# Patient Record
Sex: Male | Born: 2000 | Race: Black or African American | Hispanic: No | Marital: Single | Smoking: Never smoker
Health system: Southern US, Community
[De-identification: ages and names within clinical notes are randomized; demographics above are authoritative.]

## PROBLEM LIST (undated history)

## (undated) DIAGNOSIS — H539 Unspecified visual disturbance: Secondary | ICD-10-CM

## (undated) HISTORY — DX: Unspecified visual disturbance: H53.9

## (undated) HISTORY — PX: APPENDECTOMY: SHX54

---
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
## 2022-01-31 ENCOUNTER — Other Ambulatory Visit: Payer: Self-pay

## 2022-01-31 ENCOUNTER — Emergency Department (HOSPITAL_COMMUNITY)
Admission: EM | Admit: 2022-01-31 | Discharge: 2022-01-31 | Disposition: A | Discharge status: Home / Self Care | Payer: Medicaid Other | Attending: Emergency Medicine | Admitting: Emergency Medicine

## 2022-01-31 ENCOUNTER — Encounter (HOSPITAL_COMMUNITY): Payer: Self-pay

## 2022-01-31 DIAGNOSIS — W01198A Fall on same level from slipping, tripping and stumbling with subsequent striking against other object, initial encounter: Secondary | ICD-10-CM | POA: Insufficient documentation

## 2022-01-31 DIAGNOSIS — S0990XA Unspecified injury of head, initial encounter: Secondary | ICD-10-CM | POA: Diagnosis present

## 2022-01-31 DIAGNOSIS — S0101XA Laceration without foreign body of scalp, initial encounter: Secondary | ICD-10-CM | POA: Insufficient documentation

## 2022-01-31 NOTE — ED Provider Triage Note (Signed)
Emergency Medicine Provider Triage Evaluation Note  Cormick Moss , a 21 y.o. male  was evaluated in triage.  Pt complains of fall.  Patient states he slipped on some water in the bathroom and struck his head against the floor.  Denies LOC.  States headache at first but seems better now.  No nausea/vomiting.  Tetanus UTD.  Review of Systems  Positive: laceration Negative: vomiting  Physical Exam  BP (!) 129/51   Pulse 98   Temp 98.7 F (37.1 C)   Resp 16   Ht 6\' 1"  (1.854 m)   Wt 97.5 kg   SpO2 100%   BMI 28.37 kg/m  Gen:   Awake, no distress   Resp:  Normal effort  MSK:   Moves extremities without difficulty  Other:  2cm laceration to occipital scalp  Medical Decision Making  Medically screening exam initiated at 10:14 PM.  Appropriate orders placed.  Espiridion Supinski was informed that the remainder of the evaluation will be completed by another provider, this initial triage assessment does not replace that evaluation, and the importance of remaining in the ED until their evaluation is complete.  Fall, laceration occipital scalp.  Tetanus UTD.  AAOx3, no focal deficits.  Will need lac repair.   Norval Morton, PA-C 01/31/22 2216

## 2022-01-31 NOTE — Discharge Instructions (Signed)
If you develop severe headache, nausea or vomiting please return for evaluation as discussed.  Overall you have had 2 staples placed in laceration to the back of your head.  These will need to be removed in about 7 to 10 days.  You can wash gently with soap and water but do not do any aggressive scrubbing in this area.

## 2022-01-31 NOTE — ED Triage Notes (Signed)
Pt reports he tripped and fell tonight at a restaurant onto a tile floor and hit the back of his head. No LOC. He reports mild headache right after but has resolved. He does have laceration to the back of his head, bleeding controlled. Does not take blood thinners. He reports neck pain as well.

## 2022-01-31 NOTE — ED Provider Notes (Signed)
[facility] EMERGENCY DEPARTMENT Provider Note   CSN: [id number] Arrival date & time: 01/31/22  2136     History  Chief Complaint  Patient presents with   Marletta Lor    Bill Jimenez is a 21 y.o. male.  Patient here with laceration to the back of his head after he slipped and fell.  No medical problems.  Does not take any blood thinners.  Patient states that he was at a restaurant when he walked into the bathroom where the floor was wet and slippery.  He fell back and hit the back of his head.  Did not lose consciousness.  Had a mild headache at first but no headache now.  No nausea or vomiting.  This occurred about 2 hours ago.  He denies any weakness or numbness or tingling.  No vision changes.  Denies any alcohol or drug use.  Denies any amnesia, disorientation, seizure.  Tetanus shot is up-to-date.  Denies any extremity pain or neck pain.  No chest pain or shortness of breath.  The history is provided by the patient.       Home Medications Prior to Admission medications   Medication Sig Start Date End Date Taking? Authorizing Provider  albuterol (PROVENTIL HFA;VENTOLIN HFA) 108 (90 Base) MCG/ACT inhaler Inhale 1-2 puffs into the lungs every 6 (six) hours as needed for wheezing or shortness of breath. Use 30 minutes before practice. Patient not taking: Reported on 09/26/2019 08/11/15 09/25/28  Estelle June, NP  ibuprofen (ADVIL,MOTRIN) 600 MG tablet Take 1 tablet (600 mg total) by mouth every 6 (six) hours as needed. Patient not taking: Reported on 07/27/2019 03/25/16   Georgiann Hahn, MD  triamcinolone (KENALOG) 0.025 % cream Apply 1 application topically 2 (two) times daily as needed. 04/05/20   Myles Gip, DO      Allergies    Patient has no known allergies.    Review of Systems   Review of Systems  Physical Exam Updated Vital Signs BP (!) 129/51   Pulse 98   Temp 98.7 F (37.1 C)   Resp 16   Ht 6\' 1"  (1.854 m)   Wt 97.5 kg   SpO2 100%    BMI 28.37 kg/m  Physical Exam Vitals and nursing note reviewed.  Constitutional:      General: He is not in acute distress.    Appearance: He is well-developed. He is not ill-appearing.  HENT:     Head:     Comments: No hemotympanum, 2 cm laceration to the occiput that is hemostatic    Right Ear: Tympanic membrane normal.     Left Ear: Tympanic membrane normal.     Nose: Nose normal.     Mouth/Throat:     Mouth: Mucous membranes are moist.  Eyes:     Extraocular Movements: Extraocular movements intact.     Conjunctiva/sclera: Conjunctivae normal.     Pupils: Pupils are equal, round, and reactive to light.  Cardiovascular:     Rate and Rhythm: Normal rate and regular rhythm.     Pulses: Normal pulses.     Heart sounds: Normal heart sounds. No murmur heard. Pulmonary:     Effort: Pulmonary effort is normal. No respiratory distress.     Breath sounds: Normal breath sounds.  Abdominal:     Palpations: Abdomen is soft.     Tenderness: There is no abdominal tenderness.  Musculoskeletal:        General: No swelling or tenderness.  Cervical back: Normal range of motion and neck supple.     Comments: No midline spinal tenderness  Skin:    General: Skin is warm and dry.     Capillary Refill: Capillary refill takes less than 2 seconds.  Neurological:     General: No focal deficit present.     Mental Status: He is alert and oriented to person, place, and time.     Cranial Nerves: No cranial nerve deficit.     Sensory: No sensory deficit.     Motor: No weakness.     Coordination: Coordination normal.     Gait: Gait normal.     Comments: 5+ out of 5 strength throughout, normal sensation, no drift, normal speech, normal finger-nose-finger, normal gait  Psychiatric:        Mood and Affect: Mood normal.     ED Results / Procedures / Treatments   Labs (all labs ordered are listed, but only abnormal results are displayed) Labs Reviewed - No data to  display  EKG None  Radiology No results found.  Procedures .Marland KitchenLaceration Repair  Date/Time: 01/31/2022 11:13 PM  Performed by: Virgina Norfolk, DO Authorized by: Virgina Norfolk, DO   Consent:    Consent obtained:  Verbal   Consent given by:  Patient   Risks, benefits, and alternatives were discussed: yes     Risks discussed:  Infection, need for additional repair, nerve damage, poor cosmetic result, poor wound healing, pain, tendon damage, vascular damage and retained foreign body   Alternatives discussed:  No treatment Universal protocol:    Procedure explained and questions answered to patient or proxy's satisfaction: yes     Relevant documents present and verified: yes     Patient identity confirmed:  Arm band and verbally with patient Anesthesia:    Anesthesia method:  None Laceration details:    Location:  Scalp   Scalp location:  Occipital   Length (cm):  2   Depth (mm):  1 Pre-procedure details:    Preparation:  Patient was prepped and draped in usual sterile fashion Exploration:    Limited defect created (wound extended): no     Imaging outcome: foreign body not noted     Wound exploration: wound explored through full range of motion and entire depth of wound visualized     Wound extent: areolar tissue not violated, fascia not violated, no foreign body, no signs of injury, no nerve damage, no tendon damage, no underlying fracture and no vascular damage     Contaminated: no   Treatment:    Area cleansed with:  Shur-Clens   Amount of cleaning:  Standard   Visualized foreign bodies/material removed: no     Debridement:  None   Undermining:  None Skin repair:    Repair method:  Staples   Number of staples:  2 Approximation:    Approximation:  Close Repair type:    Repair type:  Simple Post-procedure details:    Dressing:  Open (no dressing)   Procedure completion:  Tolerated     Medications Ordered in ED Medications - No data to display  ED Course/  Medical Decision Making/ A&P                           Medical Decision Making  Bill Jimenez is here with laceration.  Normal vitals.  No fever.  Patient tripped and fell in the bathroom with a wet floor.  Hit the back of his  head.  Did not lose consciousness.  Had a headache initially when it occurred but no longer has headache.  No neck pain.  No extremity pain.  Denies any nausea or vomiting.  Tetanus shot is up-to-date.  He has no signs of basilar skull fracture on exam.  He has small 2 cm hemostatic laceration to the back of his head.  He is not on blood thinners.  He is well-appearing.  Neurologically he is intact.  Ambulatory without any issues.  No midline spinal pain.  [place] head CT rule negative and no need for head CT.  Shared decision was made on this as well and patient is comfortable without a head CT.  He denies any alcohol or drug use.  He is overall very well-appearing.  Wound was washed out and stapled with 2 staples.  Wound care instructions given.  He understands to return for staple removal.  Overall given return precautions.  At this time he is over 2 hours post this injury and he is neurologically intact without any symptoms we will hold off on any imaging at this time.  Patient discharged in good condition.  This chart was dictated using voice recognition software.  Despite best efforts to proofread,  errors can occur which can change the documentation meaning.         Final Clinical Impression(s) / ED Diagnoses Final diagnoses:  Injury of head, initial encounter  Laceration of scalp without foreign body, initial encounter    Rx / DC Orders ED Discharge Orders     None         Virgina Norfolk, DO 01/31/22 2315

## 2022-02-03 ENCOUNTER — Telehealth: Payer: Self-pay | Admitting: Pediatrics

## 2022-02-03 NOTE — Telephone Encounter (Signed)
Transition Care Management Unsuccessful Follow-up Telephone Call  Date of discharge and from where:  01/31/2022 [facility]  Attempts:  1st Attempt  Reason for unsuccessful TCM follow-up call:  Left voice message

## 2022-02-11 ENCOUNTER — Ambulatory Visit (INDEPENDENT_AMBULATORY_CARE_PROVIDER_SITE_OTHER): Payer: Medicaid Other | Admitting: Pediatrics

## 2022-02-11 ENCOUNTER — Encounter: Payer: Self-pay | Admitting: Pediatrics

## 2022-02-11 VITALS — BP 124/68 | Ht 73.2 in | Wt 229.8 lb

## 2022-02-11 DIAGNOSIS — Z0000 Encounter for general adult medical examination without abnormal findings: Secondary | ICD-10-CM

## 2022-02-11 DIAGNOSIS — Z23 Encounter for immunization: Secondary | ICD-10-CM

## 2022-02-11 DIAGNOSIS — Z00129 Encounter for routine child health examination without abnormal findings: Secondary | ICD-10-CM

## 2022-02-11 DIAGNOSIS — Z68.41 Body mass index (BMI) pediatric, 85th percentile to less than 95th percentile for age: Secondary | ICD-10-CM

## 2022-02-11 NOTE — Progress Notes (Unsigned)
Adolescent Well Care Visit Bill Jimenez is a 21 y.o. male who is here for well care.    PCP:  Myles Gip, DO   History was provided by the patient.  Confidentiality was discussed with the patient and, if applicable, with caregiver as well.  Current Issues: Current concerns include:  Graduated and working at call center.  Slipped and fell about 1 week ago and staple need removed today.  Have started cardio and running bid weekly.   Nutrition: Nutrition/Eating Behaviors: good eater, 2 meals/day plus snacks, eats all food groups, mainly drinks water, protein shakes  Adequate calcium in diet?: adequate Supplements/ Vitamins: multivi  Exercise/ Media: Play any Sports?/ Exercise: cardio  Screen Time:  > 2 hours-counseling provided Media Rules or Monitoring?: no  Sleep:  Sleep: 8hrs  Social Screening: Lives with:  mom Parental relations:  good Activities, Work, and Regulatory affairs officer?: yes Concerns regarding behavior with peers?  no Stressors of note: no  Education: School Name: grauated, currently working at call center.  Plans to apply to college.    Menstruation:   No LMP for male patient. Menstrual History: male   Confidential Social History: Tobacco?  no Secondhand smoke exposure?  no Drugs/ETOH?  no  Sexually Active?  Yes, 1 partner, was tested as he wasn't wearing condoms.  Declines testing today. Pregnancy Prevention: discussed  Safe at home, in school & in relationships?  Yes Safe to self?  Yes   Screenings: Patient has a dental home: yes, has dentist, brush bid   In addition, the following topics were discussed as part of anticipatory guidance healthy eating, exercise, condom use, and screen time.  PHQ-9 completed and results indicated no concerns. Score: 0   Physical Exam:  Vitals:   02/11/22 1146  BP: 124/68  Weight: 229 lb 12.8 oz (104.2 kg)  Height: 6' 1.2" (1.859 m)   BP 124/68   Ht 6' 1.2" (1.859 m)   Wt 229 lb 12.8 oz (104.2 kg)   BMI  30.15 kg/m  Body mass index: body mass index is 30.15 kg/m. Blood pressure %iles are not available for patients who are 18 years or older.  Hearing Screening   500Hz  1000Hz  2000Hz  3000Hz  4000Hz   Right ear 25 25 25 25 25   Left ear 25 25 25 25 25    Vision Screening   Right eye Left eye Both eyes  Without correction 10/10 10/10   With correction       General Appearance:   alert, oriented, no acute distress and well nourished  HENT: Normocephalic, no obvious abnormality, conjunctiva clear  Mouth:   Normal appearing teeth, no obvious discoloration, dental caries, or dental caps  Neck:   Supple; thyroid: no enlargement, symmetric, no tenderness/mass/nodules     Lungs:   Clear to auscultation bilaterally, normal work of breathing  Heart:   Regular rate and rhythm, S1 and S2 normal, no murmurs;   Abdomen:   Soft, non-tender, no mass, or organomegaly  GU normal male genitals, no testicular masses or hernia, Tanner stage 5  Musculoskeletal:   Tone and strength strong and symmetrical, all extremities  no scoliosis             Lymphatic:   No cervical adenopathy  Skin/Hair/Nails:   Skin warm, dry and intact, no rashes, no bruises or petechiae  Neurologic:   Strength, gait, and coordination normal and age-appropriate     Assessment and Plan:   1. Encounter for well adolescent visit   2. BMI (body  mass index), pediatric, 85% to less than 95% for age     --discussed transitioning to adult care  BMI is not appropriate for age:  has made significant changes in in diet and exercising with significant decrease in weight and BMI.  Encouraged to continue good practices.    Hearing screening result:normal Vision screening result: normal  Counseling provided for all of the vaccine components  Orders Placed This Encounter  Procedures   Meningococcal B, OMV (Bexsero)   Flu Vaccine QUAD 6+ mos PF IM (Fluarix Quad PF)  --Indications, contraindications and side effects of vaccine/vaccines  discussed with parent and parent verbally expressed understanding and also agreed with the administration of vaccine/vaccines as ordered above  today.    Return in about 1 year (around 02/12/2023).Marland Kitchen  Myles Gip, DO

## 2022-02-11 NOTE — Patient Instructions (Signed)
Well Child Nutrition, Young Adult The following information provides general nutrition recommendations. Talk with a health care provider or a diet and nutrition specialist (dietitian) if you have any questions. Nutrition The amount of food you need to eat every day depends on your age, sex, size, and activity level. To figure out your daily calorie needs, look for a calorie calculator online or talk with your health care provider. Balanced diet Eat a balanced diet. Try to include: Fruits. Aim for 1-2 cups a day. Examples of 1 cup of fruit include 1 large banana, 1 small apple, 8 large strawberries, 1 large orange,  cup (80 g) dried fruit, or 1 cup (250 mL) of 100% fruit juice. Eat a variety of whole fruits and 100% fruit juice. Choose fresh, canned, frozen, or dried forms. Choose canned fruit that has the lowest added sugar or no added sugar. Vegetables. Aim for 2-4 cups a day. Examples of 1 cup of vegetables include 2 medium carrots, 1 large tomato, 2 stalks of celery, or 2 cups (62 g) of raw leafy greens. Choose fresh, frozen, canned, and dried options. Eat vegetables of a variety of colors. Low-fat or fat-free dairy. Aim for 3 cups a day. Examples of 1 cup of dairy include 8 oz (230 mL) of milk, 8 oz (230 g) of yogurt, or 1 oz (44 g) of natural cheese. If you are unable to tolerate dairy (lactose intolerant) or you choose not to consume dairy, you may include fortified soy beverages (soy milk). Grains. Aim for 6-10 "ounce-equivalents" of grain foods (such as pasta, rice, and tortillas) a day. Examples of 1 ounce-equivalent of grains include 1 cup (60 g) of ready-to-eat cereal,  cup (79 g) of cooked rice, or 1 slice of bread. Of the grain foods that you eat each day, aim to include 3-5 ounce-equivalents of whole-grain options. Examples of whole grains include whole wheat, brown rice, wild rice, quinoa, and oats. Lean proteins. Aim for 5-7 ounce-equivalents a day. Eat a variety of protein foods,  including lean meats, seafood, poultry, eggs, legumes (beans and peas), nuts, seeds, and soy products. A cut of meat or fish that is the size of a deck of cards is about 3-4 ounce-equivalents (85 g). Foods that provide 1 ounce-equivalent of protein include 1 egg,  ounce (28 g) of nuts or seeds, or 1 tablespoon (16 g) of peanut butter. For more information and options for foods in a balanced diet, visit www.choosemyplate.gov Tips for healthy snacking A snack should not be the size of a full meal. Eat snacks that have 200 calories or less. Examples include:  whole-wheat pita with  cup (40 g) hummus. 2 or 3 slices of deli turkey wrapped around a cheese stick.  apple with 1 tablespoon (16 g) of peanut butter. 10 baked chips with salsa. Keep cut-up fruits and vegetables available at home and at school so they are easy to eat. Pack healthy snacks the night before or when you pack your lunch. Avoid pre-packaged foods. These tend to be higher in fat, sugar, and salt (sodium). Get involved with shopping, or ask the primary food shopper in your household to get healthy snacks that you like. Avoid chips, candy, cake, and soft drinks. Foods to avoid Fried or heavily processed foods, such as toaster pastries and microwaveable dinners. Drinks that contain a lot of sugar, such as sports drinks, sodas, and juice. Foods that contain a lot of fat, sodium, or sugar. Food safety Prepare your food safely: Wash your hands   after handling raw meats. Keep food preparation surfaces clean by washing them regularly with hot, soapy water. Keep raw meats separate from foods that are ready-to-eat, such as fruits and vegetables. Cook seafood, meat, poultry, and eggs to the recommended minimum safe internal temperature. Store foods at safe temperatures. In general: Keep cold foods at 40F (4C) or colder. Keep your freezer at 0F (-18C or 18 degrees below 0C) or colder. Keep hot foods at 140F (60C) or  warmer. Foods are no longer safe to eat when they have been at a temperature of 40-140F (4-60C) for more than 2 hours. Physical activity Try to get 150 minutes of moderate-intensity physical activity each week. Examples include walking briskly or bicycling slower than 10 miles an hour (16 km an hour). Do muscle-strengthening exercises on 2 or more days a week. If you find it difficult to fit regular physical activity into your schedule, try: Taking the stairs instead of the elevator. Parking your car farther from the entrance or at the back of the parking lot. Biking or walking to work or school. If you need to lose weight, you may need to reduce your daily calorie intake and increase your daily amount of physical activity. Check with your health care provider before you start a new diet and exercise plan. General instructions Do not skip meals, especially breakfast. Water is the ideal beverage. Aim to drink six 8-oz (240 mL) glasses of water each day. Avoid fad diets. These may affect your mood and growth. If you choose to drink alcohol: Drink in moderation. This means two drinks a day for men and one drink a day for women who are not pregnant. One drink equals 12 oz (355 mL) of beer, 5 oz (148 mL) of wine, or 1 oz (44 mL) of hard liquor. You may drink coffee. It is recommended that you limit coffee intake to three to five 8-oz (240 mL) cups a day (up to 400 mg of caffeine). If you are worried about your body image, talk with your parents, your health care provider, or another trusted adult like a coach or counselor. You may be at risk for developing an eating disorder. Eating disorders can lead to serious medical problems. Food allergies may cause you to have a reaction (such as a rash, diarrhea, or vomiting) after eating or drinking. Talk with your health care provider if you have concerns about food allergies. Summary Eat a balanced diet. Include fruits, vegetables, low-fat dairy, whole  grains, and lean proteins. Try to get 150 minutes of moderate-intensity physical activity each week, and do muscle-strengthening exercises on 2 or more days a week. Choose healthy snacks that are 200 calories or less. Drink plenty of water. Try to drink six 8-oz (240 mL) glasses a day. This information is not intended to replace advice given to you by your health care provider. Make sure you discuss any questions you have with your health care provider. Document Revised: 02/19/2021 Document Reviewed: 02/19/2021 Elsevier Patient Education  2023 Elsevier Inc.  

## 2022-02-13 ENCOUNTER — Encounter: Payer: Self-pay | Admitting: Pediatrics

## 2022-02-20 IMAGING — CT CT HEAD W/O CM
4 series · 17 of 47 positions shown, 19 images · non-contrast
Comparison: None.

CLINICAL DATA: Generalized weakness and vision changes

EXAM:
CT HEAD WITHOUT CONTRAST
TECHNIQUE: Contiguous axial images were obtained from the base of the skull
through the vertex without intravenous contrast.

[Series 3: head wo · axial · 0.47mm/px · z∈[-204,-70]mm · 7 of 37 slices shown, 9 images]
[im 5/37  brain]
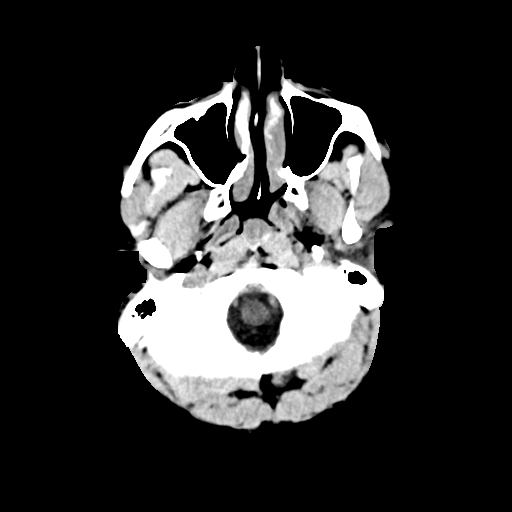
[im 5/37  bone]
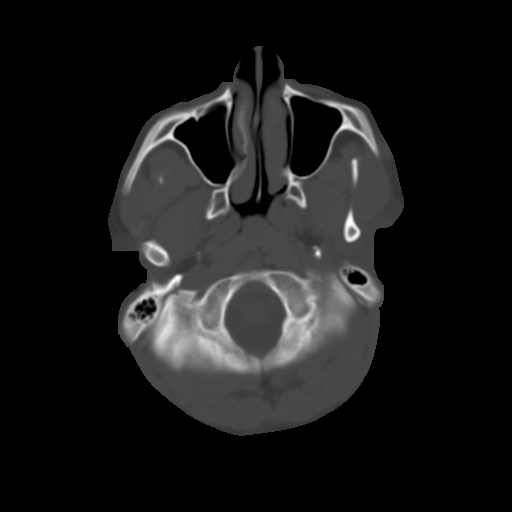
[im 10/37  brain]
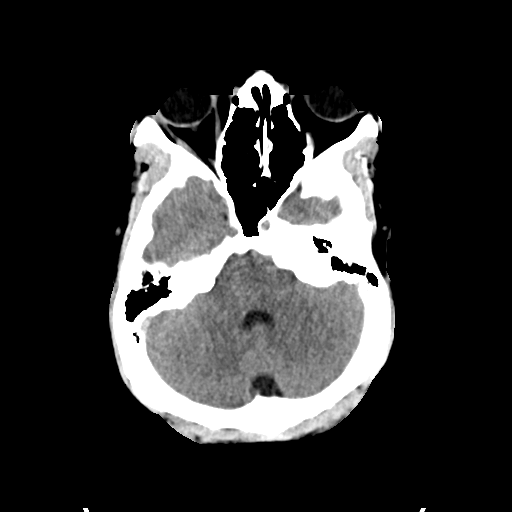
[im 14/37  brain]
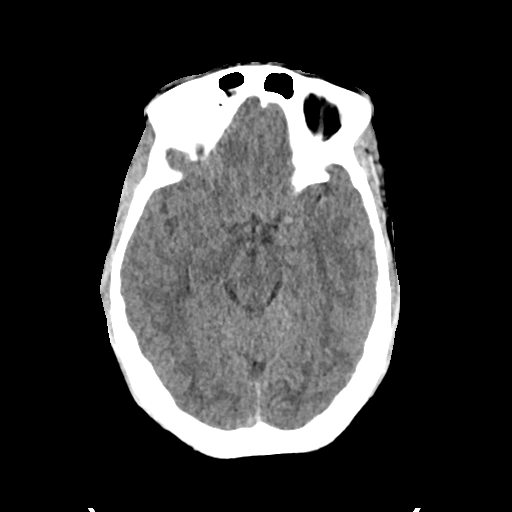
[im 19/37  brain]
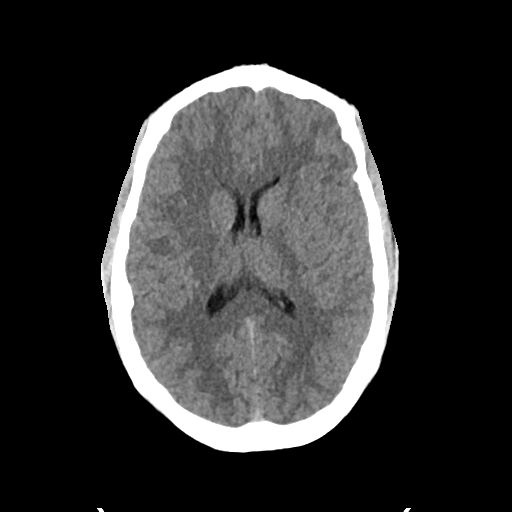
[im 23/37  brain]
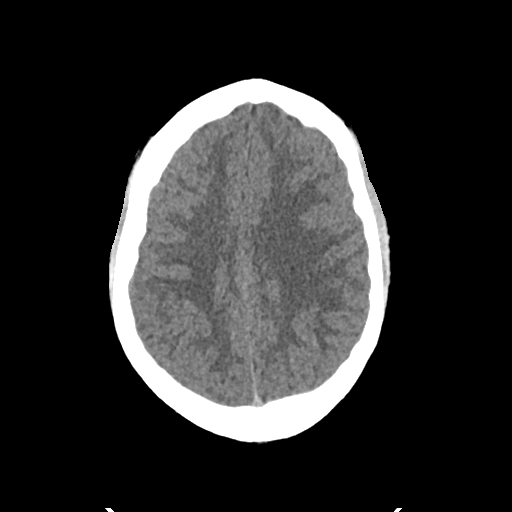
[im 23/37  bone]
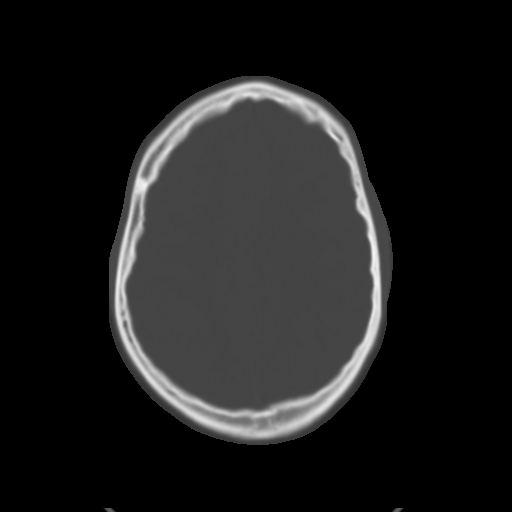
[im 28/37  brain]
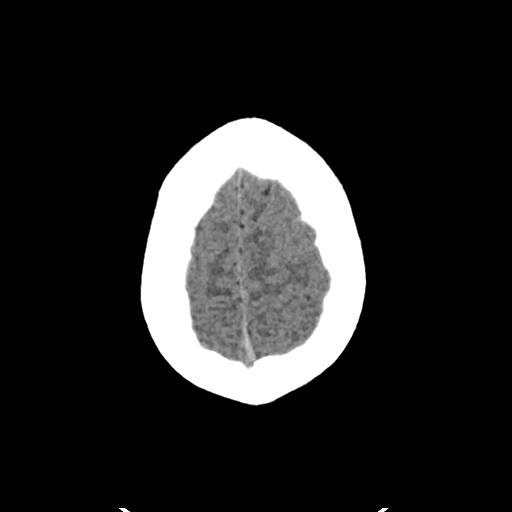
[im 32/37  brain]
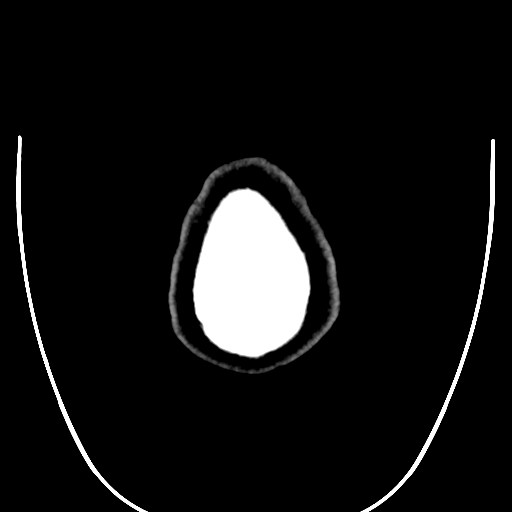

[Series 4: head bone · axial · 0.47mm/px · z∈[-206,-144]mm · 4 of 91 slices shown]
[im 10/91  bone]
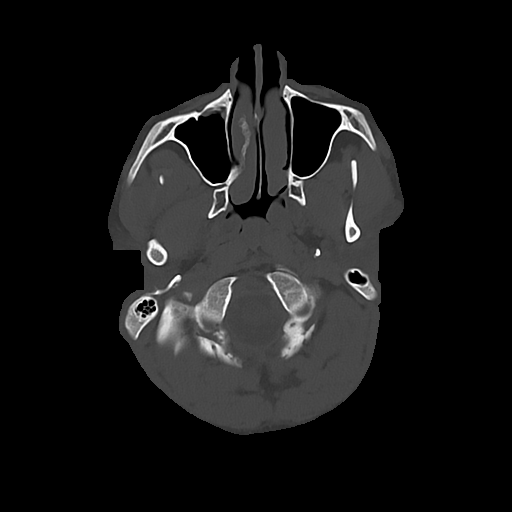
[im 19/91  bone]
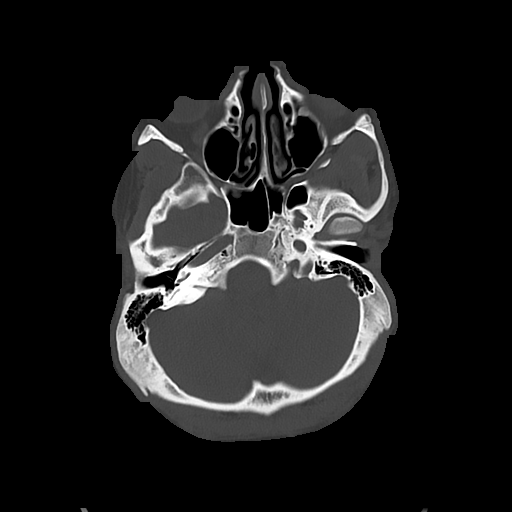
[im 28/91  bone]
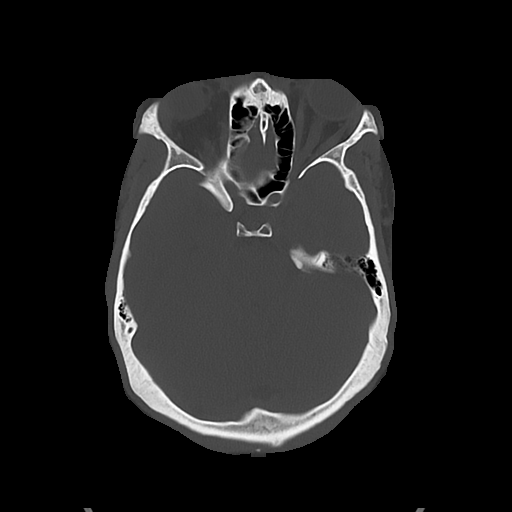
[im 41/91  bone]
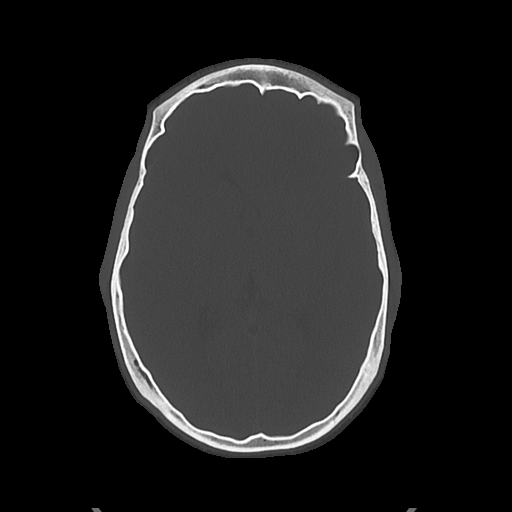

[Series 5: cor soft · coronal · 0.33mm/px · 3 of 70 slices shown]
[im 24/70  brain]
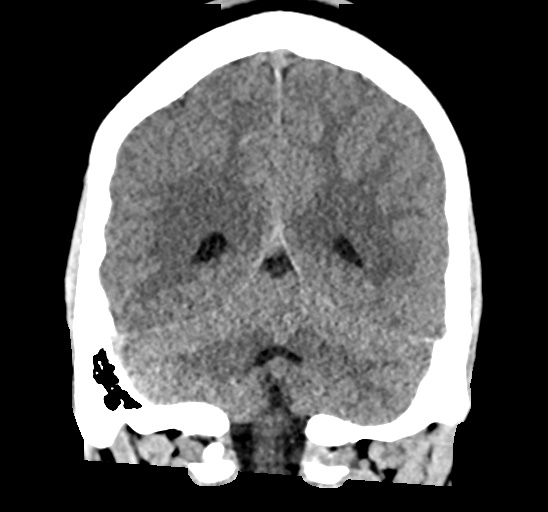
[im 31/70  brain]
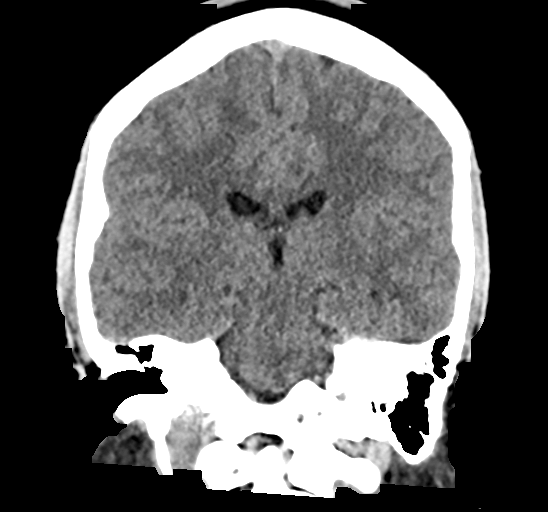
[im 39/70  brain]
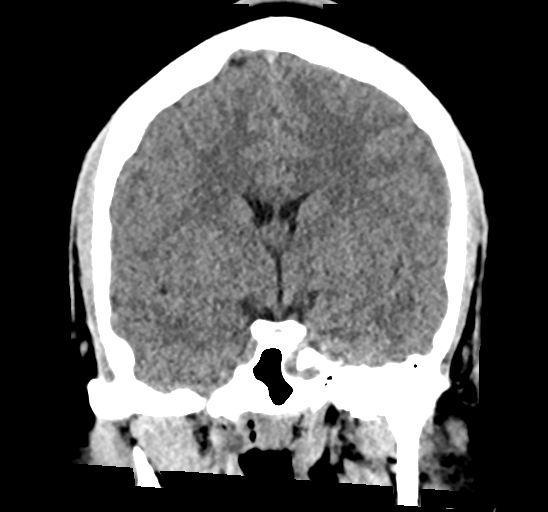

[Series 6: sag soft · sagittal · 0.33mm/px · 3 of 61 slices shown]
[im 21/61  brain]
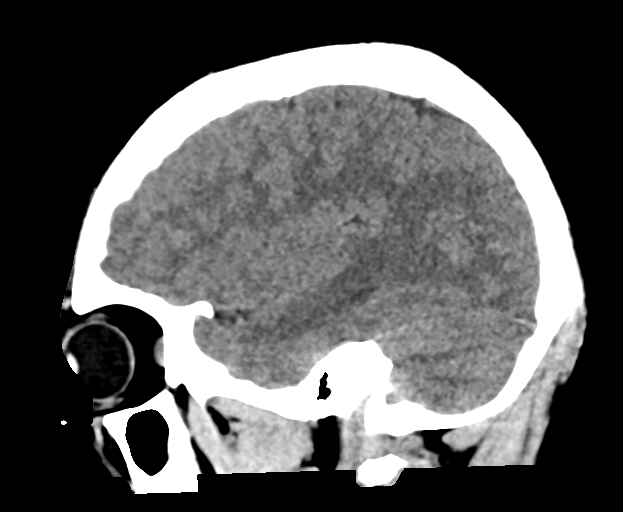
[im 31/61  brain]
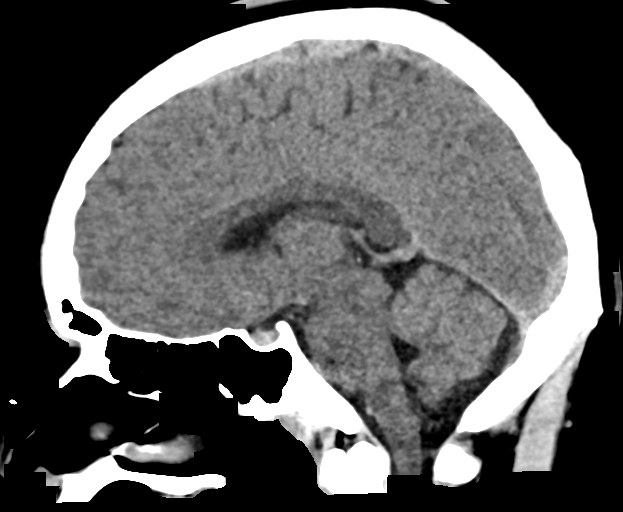
[im 41/61  brain]
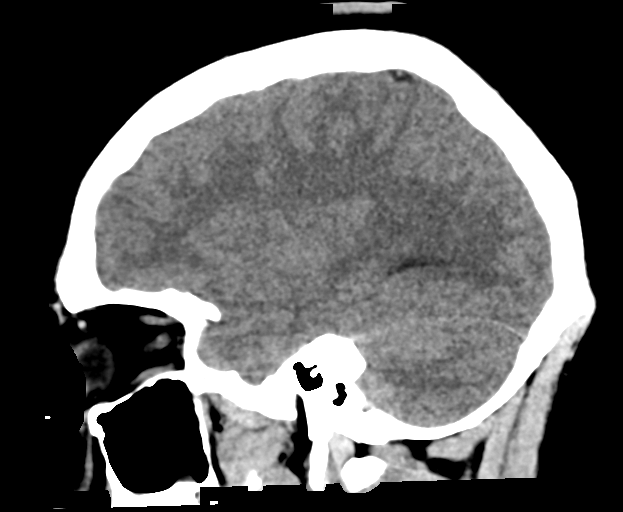

[17 of 47 positions shown; findings below may reference images not displayed]

FINDINGS: Brain: There is no mass, hemorrhage or extra-axial collection. The
size and configuration of the ventricles and extra-axial CSF spaces
are normal. The brain parenchyma is normal, without acute or chronic
infarction.

Vascular: No abnormal hyperdensity of the major intracranial
arteries or dural venous sinuses. No intracranial atherosclerosis.

Skull: The visualized skull base, calvarium and extracranial soft
tissues are normal.

Sinuses/Orbits: No fluid levels or advanced mucosal thickening of
the visualized paranasal sinuses. No mastoid or middle ear effusion.
The orbits are normal.
IMPRESSION: Normal head CT.

## 2022-03-25 ENCOUNTER — Ambulatory Visit (INDEPENDENT_AMBULATORY_CARE_PROVIDER_SITE_OTHER): Payer: Medicaid Other | Admitting: Pediatrics

## 2022-03-25 VITALS — BP 114/80 | Ht 72.0 in | Wt 212.3 lb

## 2022-03-25 DIAGNOSIS — F909 Attention-deficit hyperactivity disorder, unspecified type: Secondary | ICD-10-CM

## 2022-03-25 MED ORDER — METHYLPHENIDATE HCL ER (OSM) 36 MG PO TBCR: 36.0000 mg | EXTENDED_RELEASE_TABLET | Freq: Every day | ORAL | 0 refills | Status: DC

## 2022-03-25 NOTE — Progress Notes (Signed)
  Subjective:    Bill Jimenez is a 22 y.o. old male here with his mother for Consult   HPI: Bill Jimenez presents with history of reports that since he was having issues with concentration and focus.  Reports that he was diagnosed with ADHD when he was a child around [age]yr and then stopped around [age]yr.  He feels like he has always had an issue with concentration but was manageable but now feels harder.  He is currently in a trade school but it has been difficult to get through.  He reports he is always very fidgety.  Feels like it takes I'm much longer to finish work and complete and grades are just average.  Mom reports that he will never really complete tasks.  Mom feels like he had concernta in past and that did seem to work well.    The following portions of the patient's history were reviewed and updated as appropriate: allergies, current medications, past family history, past medical history, past social history, past surgical history and problem list.  Review of Systems Pertinent items are noted in HPI.   Allergies: No Known Allergies   Current Outpatient Medications on File Prior to Visit  Medication Sig Dispense Refill   ibuprofen (ADVIL,MOTRIN) 600 MG tablet Take 1 tablet (600 mg total) by mouth every 6 (six) hours as needed. (Patient not taking: Reported on 07/27/2019) 30 tablet 0   No current facility-administered medications on file prior to visit.    History and Problem List: Past Medical History:  Diagnosis Date   Vision abnormalities         Objective:    BP 114/80   Ht 6' (1.829 m)   Wt 212 lb 4.8 oz (96.3 kg)   BMI 28.79 kg/m   General: alert, active, non toxic, age appropriate interaction Lungs: clear to auscultation, no wheeze, crackles or retractions, unlabored breathing Heart: RRR, Nl S1, S2, no murmurs Skin: no rashes Neuro: normal mental status, No focal deficits  No results found for this or any previous visit (from the past 72 hour(s)).     Assessment:    Bill Jimenez is a 22 y.o. old male with  1. Attention deficit hyperactivity disorder (ADHD), unspecified ADHD type     Plan:    --Normal growth parameters and Blood pressure.  --Plan to trial back on Concerta which he tolerated in past.  [name] to contact in a few weeks if dose tolerated well.  Call or return if worsening side effects or not tolerating.  If doing well will plan to send 2 post date scripts and to follow up in 3 months for ADHD check.   Meds ordered this encounter  Medications   methylphenidate (CONCERTA) 36 MG PO CR tablet    Sig: Take 1 tablet (36 mg total) by mouth daily.    Dispense:  30 tablet    Refill:  0    Return in about 3 months (around 06/24/2022).  Evelena Asa Breelle Hollywood D.O.

## 2022-03-27 DIAGNOSIS — L731 Pseudofolliculitis barbae: Secondary | ICD-10-CM | POA: Diagnosis not present

## 2022-04-09 ENCOUNTER — Encounter: Payer: Self-pay | Admitting: Pediatrics

## 2022-04-09 NOTE — Patient Instructions (Signed)
Living With Attention Deficit Hyperactivity Disorder If you have been diagnosed with attention deficit hyperactivity disorder (ADHD), you may be relieved that you now know why you have felt or behaved a certain way. Still, you may feel overwhelmed about the treatment ahead. You may also wonder how to get the support you need and how to deal with the condition day-to-day. With treatment and support, you can live with ADHD and manage your symptoms. How to manage lifestyle changes Managing lifestyle changes can be challenging. Seeking support from your healthcare provider, therapist, family, and friends can be helpful. How to recognize changes in your condition The following signs may mean that your treatment is working well and your condition is improving: Consistently being on time for appointments. Being more organized at home and work. Other people noticing improvements in your behavior. Achieving goals that you set for yourself. Thinking more clearly. The following signs may mean that your treatment is not working very well: Feeling impatience or more confusion. Missing, forgetting, or being late for appointments. An increasing sense of disorganization and messiness. More difficulty in reaching goals that you set for yourself. Loved ones becoming angry or frustrated with you. Follow these instructions at home: Medicines Take over-the-counter and prescription medicines only as told by your health care provider. Check with your health care provider before taking any new medicines. General instructions Create structure and an organized atmosphere at home. For example: Make a list of tasks, then rank them from most important to least important. Work on one task at a time until your listed tasks are done. Make a daily schedule and follow it consistently every day. Use an appointment calendar, and check it 2-3 times a day to keep on track. Keep it with you when you leave the house. Create  spaces where you keep certain things, and always put things back in their places after you use them. Keep all follow-up visits. Your health care provider will need to monitor your condition and adjust your treatment over time. Where to find support Talking to others  Keep emotion out of important discussions and speak in a calm, logical way. Listen closely and patiently to your loved ones. Try to understand their point of view, and try to avoid getting defensive. Take responsibility for the consequences of your actions. Ask that others do not take your behaviors personally. Aim to solve problems as they come up, and express your feelings instead of bottling them up. Talk openly about what you need from your loved ones and how they can support you. Consider going to family therapy sessions or having your family meet with a specialist who deals with ADHD-related behavior problems. Finances Not all insurance plans cover mental health care, so it is important to check with your insurance carrier. If paying for co-pays or counseling services is a problem, search for a local or county mental health care center. Public mental health care services may be offered there at a low cost or no cost when you are not able to see a private health care provider. If you are taking medicine for ADHD, you may be able to get the generic form, which may be less expensive than brand-name medicine. Some makers of prescription medicines also offer help to patients who cannot afford the medicines that they need. Therapy and support groups Talking with a mental health care provider and participating in support groups can help to improve your quality of life, daily functioning, and overall symptoms. Questions to ask your health   care provider: What are the risks and benefits of taking medicines? Would I benefit from therapy? How often should I follow up with a health care provider? Where to find more information Learn more  about ADHD from: Children and Adults with Attention Deficit Hyperactivity Disorder: chadd.org National Institute of Mental Health: nimh.nih.gov Centers for Disease Control and Prevention: cdc.gov Contact a health care provider if: You have side effects from your medicines, such as: Repeated muscle twitches, coughing, or speech outbursts. Sleep problems. Loss of appetite. Dizziness. Unusually fast heartbeat. Stomach pains. Headaches. You have new or worsening behavior problems. You are struggling with anxiety, depression, or substance abuse. Get help right away if: You have a severe reaction to a medicine. These symptoms may be an emergency. Get help right away. Call 911. Do not wait to see if the symptoms will go away. Do not drive yourself to the hospital. Take one of these steps if you feel like you may hurt yourself or others, or have thoughts about taking your own life: Go to your nearest emergency room. Call 911. Call the National Suicide Prevention Lifeline at 1-800-273-8255 or 988. This is open 24 hours a day. Text the Crisis Text Line at 741741. Summary With treatment and support, you can live with ADHD and manage your symptoms. Consider taking part in family therapy or self-help groups with family members or friends. When you talk with friends and family about your ADHD, be patient and communicate openly. Keep all follow-up visits. Your health care provider will need to monitor your condition and adjust your treatment over time. This information is not intended to replace advice given to you by your health care provider. Make sure you discuss any questions you have with your health care provider. Document Revised: 06/21/2021 Document Reviewed: 06/21/2021 Elsevier Patient Education  2023 Elsevier Inc.  

## 2022-04-21 ENCOUNTER — Telehealth: Payer: Self-pay | Admitting: Pediatrics

## 2022-04-21 NOTE — Telephone Encounter (Signed)
Mother called to get a refill on Concerta for [place]. Mother stated that the medication is working fine. Mother requested [facility].

## 2022-04-22 ENCOUNTER — Telehealth: Payer: Self-pay | Admitting: Pediatrics

## 2022-04-22 ENCOUNTER — Telehealth: Payer: Self-pay

## 2022-04-22 MED ORDER — METHYLPHENIDATE HCL ER (OSM) 36 MG PO TBCR: 36.0000 mg | EXTENDED_RELEASE_TABLET | Freq: Every day | ORAL | 0 refills | Status: DC

## 2022-04-22 NOTE — Telephone Encounter (Signed)
Mother called to get a refill on Concerta for [place]. Mother stated that the medication is working fine. Mother requested [facility] [address]. 

## 2022-04-22 NOTE — Telephone Encounter (Signed)
Replaced previous Concerta scrip as the starting date was incorrect.   Meds ordered this encounter  Medications   methylphenidate (CONCERTA) 36 MG PO CR tablet    Sig: Take 1 tablet (36 mg total) by mouth daily.    Dispense:  30 tablet    Refill:  0    Please replace this script with previous post date of 05/21/22.  Wrong date was initially sent.

## 2022-04-22 NOTE — Telephone Encounter (Signed)
Concerta filled and sent to pharmacy and to f/u in 2 months for med check.   Meds ordered this encounter  Medications   methylphenidate (CONCERTA) 36 MG PO CR tablet    Sig: Take 1 tablet (36 mg total) by mouth daily.    Dispense:  30 tablet    Refill:  0    Please do not fill till 05/22/22   methylphenidate (CONCERTA) 36 MG PO CR tablet    Sig: Take 1 tablet (36 mg total) by mouth daily.    Dispense:  30 tablet    Refill:  0

## 2022-10-16 ENCOUNTER — Encounter: Payer: Self-pay | Admitting: Pediatrics

## 2022-10-16 ENCOUNTER — Ambulatory Visit (INDEPENDENT_AMBULATORY_CARE_PROVIDER_SITE_OTHER): Payer: Self-pay | Admitting: Pediatrics

## 2022-10-16 VITALS — BP 112/70 | Ht 72.2 in | Wt 216.0 lb

## 2022-10-16 DIAGNOSIS — F909 Attention-deficit hyperactivity disorder, unspecified type: Secondary | ICD-10-CM

## 2022-10-16 MED ORDER — METHYLPHENIDATE HCL ER (OSM) 36 MG PO TBCR: 36.0000 mg | EXTENDED_RELEASE_TABLET | Freq: Every day | ORAL | 0 refills | Status: DC

## 2022-10-16 NOTE — Progress Notes (Signed)
    Rielly is a 22 y.o. old male here for ADHD medication management  BP 112/70   Ht 6' 0.2" (1.834 m)   Wt 216 lb (98 kg)   BMI 29.13 kg/m  Growth %ile SmartLinks can only be used for patients less than 40 years old.  --Normal growth parameters and Blood pressure.  --Parent reports child is doing well on present dose with no significant side effects reported.  --Plan to continue on current dose and will provide refill and 2 post dated prescriptions.  Needs to look to transition from pediatric care to adult care.   Meds ordered this encounter  Medications   methylphenidate (CONCERTA) 36 MG PO CR tablet    Sig: Take 1 tablet (36 mg total) by mouth daily.    Dispense:  30 tablet    Refill:  0   methylphenidate (CONCERTA) 36 MG PO CR tablet    Sig: Take 1 tablet (36 mg total) by mouth daily.    Dispense:  30 tablet    Refill:  0    Please do not fill till 11/15/22   methylphenidate (CONCERTA) 36 MG PO CR tablet    Sig: Take 1 tablet (36 mg total) by mouth daily.    Dispense:  30 tablet    Refill:  0    Please do not fill till 12/15/22     Myles Gip D.O.

## 2022-10-16 NOTE — Patient Instructions (Signed)
Living With Attention Deficit Hyperactivity Disorder If you have been diagnosed with attention deficit hyperactivity disorder (ADHD), you may be relieved that you now know why you have felt or behaved a certain way. Still, you may feel overwhelmed about the treatment ahead. You may also wonder how to get the support you need and how to deal with the condition day-to-day. With treatment and support, you can live with ADHD and manage your symptoms. How to manage lifestyle changes Managing lifestyle changes can be challenging. Seeking support from your healthcare provider, therapist, family, and friends can be helpful. How to recognize changes in your condition The following signs may mean that your treatment is working well and your condition is improving: Consistently being on time for appointments. Being more organized at home and work. Other people noticing improvements in your behavior. Achieving goals that you set for yourself. Thinking more clearly. The following signs may mean that your treatment is not working very well: Feeling impatience or more confusion. Missing, forgetting, or being late for appointments. An increasing sense of disorganization and messiness. More difficulty in reaching goals that you set for yourself. Loved ones becoming angry or frustrated with you. Follow these instructions at home: Medicines Take over-the-counter and prescription medicines only as told by your health care provider. Check with your health care provider before taking any new medicines. General instructions Create structure and an organized atmosphere at home. For example: Make a list of tasks, then rank them from most important to least important. Work on one task at a time until your listed tasks are done. Make a daily schedule and follow it consistently every day. Use an appointment calendar, and check it 2-3 times a day to keep on track. Keep it with you when you leave the house. Create  spaces where you keep certain things, and always put things back in their places after you use them. Keep all follow-up visits. Your health care provider will need to monitor your condition and adjust your treatment over time. Where to find support Talking to others  Keep emotion out of important discussions and speak in a calm, logical way. Listen closely and patiently to your loved ones. Try to understand their point of view, and try to avoid getting defensive. Take responsibility for the consequences of your actions. Ask that others do not take your behaviors personally. Aim to solve problems as they come up, and express your feelings instead of bottling them up. Talk openly about what you need from your loved ones and how they can support you. Consider going to family therapy sessions or having your family meet with a specialist who deals with ADHD-related behavior problems. Finances Not all insurance plans cover mental health care, so it is important to check with your insurance carrier. If paying for co-pays or counseling services is a problem, search for a local or county mental health care center. Public mental health care services may be offered there at a low cost or no cost when you are not able to see a private health care provider. If you are taking medicine for ADHD, you may be able to get the generic form, which may be less expensive than brand-name medicine. Some makers of prescription medicines also offer help to patients who cannot afford the medicines that they need. Therapy and support groups Talking with a mental health care provider and participating in support groups can help to improve your quality of life, daily functioning, and overall symptoms. Questions to ask your health   care provider: What are the risks and benefits of taking medicines? Would I benefit from therapy? How often should I follow up with a health care provider? Where to find more information Learn more  about ADHD from: Children and Adults with Attention Deficit Hyperactivity Disorder: chadd.org National Institute of Mental Health: nimh.nih.gov Centers for Disease Control and Prevention: cdc.gov Contact a health care provider if: You have side effects from your medicines, such as: Repeated muscle twitches, coughing, or speech outbursts. Sleep problems. Loss of appetite. Dizziness. Unusually fast heartbeat. Stomach pains. Headaches. You have new or worsening behavior problems. You are struggling with anxiety, depression, or substance abuse. Get help right away if: You have a severe reaction to a medicine. These symptoms may be an emergency. Get help right away. Call 911. Do not wait to see if the symptoms will go away. Do not drive yourself to the hospital. Take one of these steps if you feel like you may hurt yourself or others, or have thoughts about taking your own life: Go to your nearest emergency room. Call 911. Call the National Suicide Prevention Lifeline at 1-800-273-8255 or 988. This is open 24 hours a day. Text the Crisis Text Line at 741741. Summary With treatment and support, you can live with ADHD and manage your symptoms. Consider taking part in family therapy or self-help groups with family members or friends. When you talk with friends and family about your ADHD, be patient and communicate openly. Keep all follow-up visits. Your health care provider will need to monitor your condition and adjust your treatment over time. This information is not intended to replace advice given to you by your health care provider. Make sure you discuss any questions you have with your health care provider. Document Revised: 06/21/2021 Document Reviewed: 06/21/2021 Elsevier Patient Education  2024 Elsevier Inc.  

## 2023-01-20 ENCOUNTER — Ambulatory Visit: Payer: Medicaid Other | Admitting: Pediatrics

## 2023-01-20 ENCOUNTER — Encounter: Payer: Self-pay | Admitting: Pediatrics

## 2023-01-20 VITALS — BP 110/82 | Ht 72.0 in | Wt 215.6 lb

## 2023-01-20 DIAGNOSIS — Z68.41 Body mass index (BMI) pediatric, 85th percentile to less than 95th percentile for age: Secondary | ICD-10-CM

## 2023-01-20 DIAGNOSIS — Z23 Encounter for immunization: Secondary | ICD-10-CM

## 2023-01-20 DIAGNOSIS — Z0001 Encounter for general adult medical examination with abnormal findings: Secondary | ICD-10-CM

## 2023-01-20 DIAGNOSIS — F909 Attention-deficit hyperactivity disorder, unspecified type: Secondary | ICD-10-CM | POA: Diagnosis not present

## 2023-01-20 DIAGNOSIS — Z00129 Encounter for routine child health examination without abnormal findings: Secondary | ICD-10-CM

## 2023-01-20 MED ORDER — METHYLPHENIDATE HCL ER (OSM) 36 MG PO TBCR: 36.0000 mg | EXTENDED_RELEASE_TABLET | Freq: Every day | ORAL | 0 refills | Status: AC

## 2023-01-20 MED ORDER — METHYLPHENIDATE HCL ER (OSM) 36 MG PO TBCR: 36.0000 mg | EXTENDED_RELEASE_TABLET | Freq: Every day | ORAL | 0 refills | Status: DC

## 2023-01-20 NOTE — Patient Instructions (Signed)
Preventive Care 18-21 Years Old, Male Preventive care refers to lifestyle choices and visits with your health care provider that can promote health and wellness. At this stage in your life, you may start seeing a primary care physician instead of a pediatrician for your preventive care. Preventive care visits are also called wellness exams. What can I expect for my preventive care visit? Counseling During your preventive care visit, your health care provider may ask about your: Medical history, including: Past medical problems. Family medical history. Current health, including: Home life and relationship well-being. Emotional well-being. Sexual activity and sexual health. Lifestyle, including: Alcohol, nicotine or tobacco, and drug use. Access to firearms. Diet, exercise, and sleep habits. Sunscreen use. Motor vehicle safety. Physical exam Your health care provider may check your: Height and weight. These may be used to calculate your BMI (body mass index). BMI is a measurement that tells if you are at a healthy weight. Waist circumference. This measures the distance around your waistline. This measurement also tells if you are at a healthy weight and may help predict your risk of certain diseases, such as type 2 diabetes and high blood pressure. Heart rate and blood pressure. Body temperature. Skin for abnormal spots. What immunizations do I need?  Vaccines are usually given at various ages, according to a schedule. Your health care provider will recommend vaccines for you based on your age, medical history, and lifestyle or other factors, such as travel or where you work. What tests do I need? Screening Your health care provider may recommend screening tests for certain conditions. This may include: Vision and hearing tests. Lipid and cholesterol levels. Hepatitis B test. Hepatitis C test. HIV (human immunodeficiency virus) test. STI (sexually transmitted infection) testing, if  you are at risk. Tuberculosis skin test. Talk with your health care provider about your test results, treatment options, and if necessary, the need for more tests. Follow these instructions at home: Eating and drinking  Eat a healthy diet that includes fresh fruits and vegetables, whole grains, lean protein, and low-fat dairy products. Drink enough fluid to keep your urine pale yellow. Do not drink alcohol if: Your health care provider tells you not to drink. You are under the legal drinking age. In the [place] the legal drinking age is 21. If you drink alcohol: Limit how much you have to 0-2 drinks a day. Know how much alcohol is in your drink. In the [place] one drink equals one 12 oz bottle of beer (355 mL), one 5 oz glass of wine (148 mL), or one 1 oz glass of hard liquor (44 mL). Lifestyle Brush your teeth every morning and night with fluoride toothpaste. Floss one time each day. Exercise for at least 30 minutes 5 or more days of the week. Do not use any products that contain nicotine or tobacco. These products include cigarettes, chewing tobacco, and vaping devices, such as e-cigarettes. If you need help quitting, ask your health care provider. Do not use drugs. If you are sexually active, practice safe sex. Use a condom or other form of protection to prevent STIs. Find healthy ways to manage stress, such as: Meditation, yoga, or listening to music. Journaling. Talking to a trusted person. Spending time with friends and family. Safety Always wear your seat belt while driving or riding in a vehicle. Do not drive: If you have been drinking alcohol. Do not ride with someone who has been drinking. When you are tired or distracted. While texting. If you have been using   any mind-altering substances or drugs. Wear a helmet and other protective equipment during sports activities. If you have firearms in your house, make sure you follow all gun safety procedures. Seek help if you have  been bullied, physically abused, or sexually abused. Use the internet responsibly to avoid dangers, such as online bullying and online sex predators. What's next? Go to your health care provider once a year for an annual wellness visit. Ask your health care provider how often you should have your eyes and teeth checked. Stay up to date on all vaccines. This information is not intended to replace advice given to you by your health care provider. Make sure you discuss any questions you have with your health care provider. Document Revised: 08/29/2020 Document Reviewed: 08/29/2020 Elsevier Patient Education  2024 Elsevier Inc.  

## 2023-01-20 NOTE — Progress Notes (Signed)
Adolescent Well Care Visit Bill Jimenez is a 22 y.o. male who is here for well care.    PCP:  Myles Gip, DO   History was provided by the patient.  Confidentiality was discussed with the patient and, if applicable, with caregiver as well.   Current Issues: Current concerns include none.  Doing well on Concerta 36mg  daily.    Nutrition: Nutrition/Eating Behaviors: good eater, 3 meals/day plus snacks, eats all food groups, mainly drinks water, protein shake, dairy  Adequate calcium in diet?: adequate Supplements/ Vitamins: multivit  Exercise/ Media: Play any Sports?/ Exercise: gym few times weekly Screen Time:  < 2 hours Media Rules or Monitoring?: yes  Sleep:  Sleep: 7hr  Social Screening: Lives with:  mom Parental relations:  good Activities, Work, and Regulatory affairs officer?: yes Concerns regarding behavior with peers?  no Stressors of note: no  Education: School Name: [employer], sophomore, [employer]/film[employer]video [employer]  Menstruation:   No LMP for male patient. Menstrual History: NA   Confidential Social History: Tobacco?  no Secondhand smoke exposure?  no Drugs/ETOH?  no  Sexually Active?  yes , GF, having sex, 1 partner.  Declines testing Pregnancy Prevention: none  Safe at home, in school & in relationships?  Yes Safe to self?  Yes   Screenings: Patient has a dental home: yes, has dentitst, brush bid  following topics were discussed as part of anticipatory guidance healthy eating, exercise, condom use, and screen time.  PHQ-9 completed and results indicated no concerns  Physical Exam:  Vitals:   01/20/23 0942  BP: 110/82  Weight: 215 lb 9.6 oz (97.8 kg)  Height: 6' (1.829 m)   BP 110/82   Ht 6' (1.829 m)   Wt 215 lb 9.6 oz (97.8 kg)   BMI 29.24 kg/m  Body mass index: body mass index is 29.24 kg/m. Growth %ile SmartLinks can only be used for patients less than 88 years old.  Hearing Screening   500Hz  1000Hz  2000Hz  3000Hz  4000Hz   Right ear 20 20 20 20 20   Left  ear 20 20 20 20 20    Vision Screening   Right eye Left eye Both eyes  Without correction 10/10 10/10   With correction       General Appearance:   alert, oriented, no acute distress and well nourished  HENT: Normocephalic, no obvious abnormality, conjunctiva clear  Mouth:   Normal appearing teeth, no obvious discoloration, dental caries, or dental caps  Neck:   Supple; thyroid: no enlargement, symmetric, no tenderness/mass/nodules  Chest Normal male  Lungs:   Clear to auscultation bilaterally, normal work of breathing  Heart:   Regular rate and rhythm, S1 and S2 normal, no murmurs;   Abdomen:   Soft, non-tender, no mass, or organomegaly  GU genitalia not examined  Musculoskeletal:   Tone and strength strong and symmetrical, all extremities   no scoliosis            Lymphatic:   No cervical adenopathy  Skin/Hair/Nails:   Skin warm, dry and intact, no rashes, no bruises or petechiae  Neurologic:   Strength, gait, and coordination normal and age-appropriate     Assessment and Plan:   1. Encounter for routine child health examination without abnormal findings   2. BMI (body mass index), pediatric, 85% to less than 95% for age   74. Attention deficit hyperactivity disorder (ADHD), unspecified ADHD type     --discussed about transitioning to adult care  --Normal growth parameters and Blood pressure.  --Parent reports child  is doing well on present dose with no significant side effects reported.  --Plan to continue on current dose and will provide refill and 2 post dated prescriptions.  Plan to return in 3 months for ADHD med check or prior for any issues or concerns.  --discussed transitioning to adult provider.   Meds ordered this encounter  Medications   methylphenidate (CONCERTA) 36 MG PO CR tablet    Sig: Take 1 tablet (36 mg total) by mouth daily.    Dispense:  30 tablet    Refill:  0   methylphenidate (CONCERTA) 36 MG PO CR tablet    Sig: Take 1 tablet (36 mg total) by  mouth daily.    Dispense:  30 tablet    Refill:  0    Please do not fill till 02/19/23   methylphenidate (CONCERTA) 36 MG PO CR tablet    Sig: Take 1 tablet (36 mg total) by mouth daily.    Dispense:  30 tablet    Refill:  0    Please do not fill till 03/20/22     BMI is not appropriate for age  Hearing screening result:normal Vision screening result: normal  Counseling provided for all of the vaccine components  Orders Placed This Encounter  Procedures   Flu vaccine trivalent PF, 6mos and older(Flulaval,Afluria,Fluarix,Fluzone)  --Indications, contraindications and side effects of vaccine/vaccines discussed with parent and parent verbally expressed understanding and also agreed with the administration of vaccine/vaccines as ordered above  today.    Return in about 1 year (around 01/20/2024).Marland Kitchen  Myles Gip, DO

## 2023-06-16 ENCOUNTER — Ambulatory Visit: Admitting: Pediatrics

## 2023-06-16 VITALS — Wt 234.1 lb

## 2023-06-16 DIAGNOSIS — R102 Pelvic and perineal pain: Secondary | ICD-10-CM | POA: Diagnosis not present

## 2023-06-16 DIAGNOSIS — R300 Dysuria: Secondary | ICD-10-CM | POA: Diagnosis not present

## 2023-06-16 DIAGNOSIS — F909 Attention-deficit hyperactivity disorder, unspecified type: Secondary | ICD-10-CM

## 2023-06-16 LAB — POCT URINALYSIS DIPSTICK
Bilirubin, UA: NEGATIVE
Blood, UA: NEGATIVE
Glucose, UA: NEGATIVE
Ketones, UA: NEGATIVE
Leukocytes, UA: NEGATIVE
Nitrite, UA: NEGATIVE
Protein, UA: NEGATIVE
Spec Grav, UA: 1.015 (ref 1.010–1.025)
Urobilinogen, UA: NEGATIVE U/dL — AB
pH, UA: 6 (ref 5.0–8.0)

## 2023-06-16 NOTE — Patient Instructions (Signed)
 Abdominal Pain, Adult    Many things can cause belly (abdominal) pain. In most cases, belly pain is not a serious problem and can be watched and treated at home. But in some cases, it can be serious.  Your doctor will try to find the cause of your belly pain.  Follow these instructions at home:  Medicines  Take over-the-counter and prescription medicines only as told by your doctor.  Do not take medicines that help you poop (laxatives) unless told by your doctor.  General instructions  Watch your belly pain for any changes. Tell your doctor if the pain gets worse.  Drink enough fluid to keep your pee (urine) pale yellow.  Contact a doctor if:  Your belly pain changes or gets worse.  You have very bad cramping or bloating in your belly.  You vomit.  Your pain gets worse with meals, after eating, or with certain foods.  You have trouble pooping or have watery poop for more than 2-3 days.  You are not hungry, or you lose weight without trying.  You have signs of not getting enough fluid or water (dehydration). These may include:  Dark pee, very Chastain pee, or no pee.  Cracked lips or dry mouth.  Feeling sleepy or weak.  You have pain when you pee or poop.  Your belly pain wakes you up at night.  You have blood in your pee.  You have a fever.  Get help right away if:  You cannot stop vomiting.  Your pain is only in one part of your belly, like on the right side.  You have bloody or black poop, or poop that looks like tar.  You have trouble breathing.  You have chest pain.  These symptoms may be an emergency. Get help right away. Call 911.  Do not wait to see if the symptoms will go away.  Do not drive yourself to the hospital.  This information is not intended to replace advice given to you by your health care provider. Make sure you discuss any questions you have with your health care provider.  Document Revised: 12/18/2021 Document Reviewed: 12/18/2021  Elsevier Patient Education  2024 [employer].

## 2023-06-16 NOTE — Progress Notes (Signed)
 Subjective:    Bill Jimenez is a 23 y.o. old male here with his  self  for Pelvic Pain and burning with urination   HPI: Bill Jimenez presents with history of increase abdominal pain starting today below stomach.  Pain is always there and still when he urinates but doesn't hurt more.  Pain is more in the middle of stomach below belly button.  Denies any fevers, diff breathing, blood in stool or urine, penile discharge, nausea.  Took some ibuprofen for pain.  Can't think of anything that he has eaten that would of caused it.  Denies any history of constipation.  Did have a BM this morning but didn't change anything.  Pain is a constant stinging like pain he reports in lower abdomen.  Is sexually active but reports no unprotected sex.   Would like to do his med check for his ADHD.  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    The following portions of the patient's history were reviewed and updated as appropriate: allergies, current medications, past family history, past medical history, past social history, past surgical history and problem list.  Review of Systems Pertinent items are noted in HPI.   Allergies: No Known Allergies   Current Outpatient Medications on File Prior to Visit  Medication Sig Dispense Refill   methylphenidate (CONCERTA) 36 MG PO CR tablet Take 1 tablet (36 mg total) by mouth daily. 30 tablet 0   methylphenidate (CONCERTA) 36 MG PO CR tablet Take 1 tablet (36 mg total) by mouth daily. 30 tablet 0   methylphenidate (CONCERTA) 36 MG PO CR tablet Take 1 tablet (36 mg total) by mouth daily. 30 tablet 0   No current facility-administered medications on file prior to visit.    History and Problem List: Past Medical History:  Diagnosis Date   Vision abnormalities         Objective:    Wt 234 lb 1 oz (106.2 kg)   BMI 31.74 kg/m   General: alert, active, non toxic, age appropriate  interaction Neck: supple, no sig LAD Lungs: clear to auscultation, no wheeze, crackles or retractions, unlabored breathing Heart: RRR, Nl S1, S2, no murmurs Abd: soft, non tender, non distended, normal BS, no organomegaly, no masses appreciated, no rebound tenderness, no pain with foot slap Skin: no rashes Neuro: normal mental status, No focal deficits  POCT urinalysis dipstick     Status: Abnormal   Collection Time: 06/16/23  3:16 PM  Result Value Ref Range   Color, UA yellow    Clarity, UA     Glucose, UA Negative Negative   Bilirubin, UA neg    Ketones, UA neg    Spec Grav, UA 1.015 1.010 - 1.025   Blood, UA neg    pH, UA 6.0 5.0 - 8.0   Protein, UA Negative Negative   Urobilinogen, UA negative (A) 0.2 or 1.0 E.U./dL   Nitrite, UA neg    Leukocytes, UA Negative Negative   Appearance clear    Odor none                                   Assessment:   Bill Jimenez is a 23 y.o. old male with  1. Burning with urination   2. Abdominal pain, suprapubic   3. Attention deficit hyperactivity disorder (ADHD), unspecified ADHD type  Plan:   --UA is essentially normal.  Will send for culture confirmation.  Will obtain urine GC/chlamydia and contact back with results when available. --exam with low concern for acute abdomen.  Consider early onset gastroenteritis.  Reports no constipation but consider KUB if pain continues.  Discussed concerning symptoms to monitor for that would need re evaluation.    --Stable on current ADHD medication Concerta 36mg  with no reported SE.  Plan to continue and return in 3 months for med check.  Discussed with him to attempt obtain new PCP to transition to adult care.     Meds ordered this encounter  Medications   methylphenidate (CONCERTA) 36 MG PO CR tablet    Sig: Take 1 tablet (36 mg total) by mouth daily.    Dispense:  30 tablet    Refill:  0   methylphenidate (CONCERTA) 36 MG PO CR tablet    Sig: Take 1 tablet (36 mg total) by mouth  daily.    Dispense:  30 tablet    Refill:  0    Please do not fill till 07/18/22   methylphenidate (CONCERTA) 36 MG PO CR tablet    Sig: Take 1 tablet (36 mg total) by mouth daily.    Dispense:  30 tablet    Refill:  0    Please do not fill till 08/17/22    Return if symptoms worsen or fail to improve. in 2-3 days or prior for concerns  Bill Gip, DO

## 2023-06-17 LAB — URINE CULTURE
MICRO NUMBER:: 16273835
Result:: NO GROWTH
SPECIMEN QUALITY:: ADEQUATE

## 2023-06-17 LAB — C. TRACHOMATIS/N. GONORRHOEAE RNA
C. trachomatis RNA, TMA: NOT DETECTED
N. gonorrhoeae RNA, TMA: NOT DETECTED

## 2023-06-18 ENCOUNTER — Encounter: Payer: Self-pay | Admitting: Pediatrics

## 2023-06-18 ENCOUNTER — Telehealth: Payer: Self-pay | Admitting: Pediatrics

## 2023-06-18 DIAGNOSIS — R102 Pelvic and perineal pain: Secondary | ICD-10-CM

## 2023-06-18 MED ORDER — METHYLPHENIDATE HCL ER (OSM) 36 MG PO TBCR: 36.0000 mg | EXTENDED_RELEASE_TABLET | Freq: Every day | ORAL | 0 refills | Status: AC

## 2023-06-18 MED ORDER — IBUPROFEN 600 MG PO TABS: 600.0000 mg | ORAL_TABLET | Freq: Four times a day (QID) | ORAL | 0 refills | Status: AC | PRN

## 2023-06-18 MED ORDER — METHYLPHENIDATE HCL ER (OSM) 36 MG PO TBCR
36.0000 mg | EXTENDED_RELEASE_TABLET | Freq: Every day | ORAL | 0 refills | Status: AC
Start: 2023-07-18 — End: 2023-08-17

## 2023-06-18 NOTE — Telephone Encounter (Signed)
 Pt's mom requested ibuprofen to be called in to [id number] - [place], [place] - [address] AT [facility] [address] & [facility] [address]

## 2023-06-18 NOTE — Telephone Encounter (Signed)
 Called and spoke to [facility] about negative lab results for GC/chlamydia and negative urine culture.  Still with underlying pain and will obtain abd xray and consider blood labs.  Ibuprofen sent to help with pain prn.  Discussed symptoms to monitor for if worsening to evaluate in ER.
